# Patient Record
Sex: Female | Born: 1975 | Race: White | Hispanic: No | Marital: Married | Smoking: Never smoker
Health system: Southern US, Community
[De-identification: ages and names within clinical notes are randomized; demographics above are authoritative.]

## PROBLEM LIST (undated history)

## (undated) DIAGNOSIS — D649 Anemia, unspecified: Secondary | ICD-10-CM

## (undated) HISTORY — PX: BREAST CYST EXCISION: SHX579

## (undated) HISTORY — PX: BREAST LUMPECTOMY: SHX2

## (undated) HISTORY — DX: Anemia, unspecified: D64.9

## (undated) HISTORY — PX: WISDOM TOOTH EXTRACTION: SHX21

---

## 2008-01-14 ENCOUNTER — Ambulatory Visit (HOSPITAL_COMMUNITY): Admission: RE | Admit: 2008-01-14 | Discharge: 2008-01-14 | Payer: Self-pay | Admitting: Obstetrics and Gynecology

## 2013-05-10 ENCOUNTER — Other Ambulatory Visit (HOSPITAL_COMMUNITY): Payer: Self-pay | Admitting: Family Medicine

## 2013-05-10 DIAGNOSIS — R319 Hematuria, unspecified: Secondary | ICD-10-CM

## 2013-05-15 ENCOUNTER — Other Ambulatory Visit (HOSPITAL_COMMUNITY): Payer: Self-pay | Admitting: Family Medicine

## 2013-05-15 DIAGNOSIS — R319 Hematuria, unspecified: Secondary | ICD-10-CM

## 2013-05-17 ENCOUNTER — Ambulatory Visit (HOSPITAL_COMMUNITY)
Admission: RE | Admit: 2013-05-17 | Discharge: 2013-05-17 | Disposition: A | Discharge status: Home / Self Care | Payer: BC Managed Care – PPO | Source: Ambulatory Visit | Attending: Family Medicine | Admitting: Family Medicine

## 2013-05-17 DIAGNOSIS — N949 Unspecified condition associated with female genital organs and menstrual cycle: Secondary | ICD-10-CM | POA: Insufficient documentation

## 2013-05-17 DIAGNOSIS — N938 Other specified abnormal uterine and vaginal bleeding: Secondary | ICD-10-CM | POA: Insufficient documentation

## 2013-05-17 DIAGNOSIS — R319 Hematuria, unspecified: Secondary | ICD-10-CM

## 2013-05-17 DIAGNOSIS — N854 Malposition of uterus: Secondary | ICD-10-CM | POA: Insufficient documentation

## 2016-06-25 ENCOUNTER — Other Ambulatory Visit: Payer: Self-pay | Admitting: Family Medicine

## 2016-06-25 DIAGNOSIS — Z1231 Encounter for screening mammogram for malignant neoplasm of breast: Secondary | ICD-10-CM

## 2016-07-03 ENCOUNTER — Emergency Department (HOSPITAL_BASED_OUTPATIENT_CLINIC_OR_DEPARTMENT_OTHER): Payer: BC Managed Care – PPO

## 2016-07-03 ENCOUNTER — Encounter (HOSPITAL_BASED_OUTPATIENT_CLINIC_OR_DEPARTMENT_OTHER): Payer: Self-pay | Admitting: Emergency Medicine

## 2016-07-03 ENCOUNTER — Emergency Department (HOSPITAL_BASED_OUTPATIENT_CLINIC_OR_DEPARTMENT_OTHER)
Admission: EM | Admit: 2016-07-03 | Discharge: 2016-07-03 | Disposition: A | Discharge status: Home / Self Care | Payer: BC Managed Care – PPO | Attending: Emergency Medicine | Admitting: Emergency Medicine

## 2016-07-03 DIAGNOSIS — G43809 Other migraine, not intractable, without status migrainosus: Secondary | ICD-10-CM | POA: Diagnosis not present

## 2016-07-03 DIAGNOSIS — R110 Nausea: Secondary | ICD-10-CM | POA: Insufficient documentation

## 2016-07-03 DIAGNOSIS — H53149 Visual discomfort, unspecified: Secondary | ICD-10-CM | POA: Insufficient documentation

## 2016-07-03 DIAGNOSIS — G43909 Migraine, unspecified, not intractable, without status migrainosus: Secondary | ICD-10-CM | POA: Diagnosis present

## 2016-07-03 LAB — CBC WITH DIFFERENTIAL/PLATELET
BASOS ABS: 0 10*3/uL (ref 0.0–0.1)
BASOS PCT: 0 %
EOS ABS: 0 10*3/uL (ref 0.0–0.7)
Eosinophils Relative: 0 %
HEMATOCRIT: 36.3 % (ref 36.0–46.0)
HEMOGLOBIN: 12.3 g/dL (ref 12.0–15.0)
Lymphocytes Relative: 12 %
Lymphs Abs: 0.9 10*3/uL (ref 0.7–4.0)
MCH: 33.2 pg (ref 26.0–34.0)
MCHC: 33.9 g/dL (ref 30.0–36.0)
MCV: 97.8 fL (ref 78.0–100.0)
MONOS PCT: 5 %
Monocytes Absolute: 0.4 10*3/uL (ref 0.1–1.0)
NEUTROS ABS: 6.1 10*3/uL (ref 1.7–7.7)
NEUTROS PCT: 83 %
Platelets: 243 10*3/uL (ref 150–400)
RBC: 3.71 MIL/uL — AB (ref 3.87–5.11)
RDW: 11.9 % (ref 11.5–15.5)
WBC: 7.5 10*3/uL (ref 4.0–10.5)

## 2016-07-03 LAB — BASIC METABOLIC PANEL
Anion gap: 9 (ref 5–15)
BUN: 12 mg/dL (ref 6–20)
CHLORIDE: 101 mmol/L (ref 101–111)
CO2: 23 mmol/L (ref 22–32)
CREATININE: 0.66 mg/dL (ref 0.44–1.00)
Calcium: 8.7 mg/dL — ABNORMAL LOW (ref 8.9–10.3)
GFR calc Af Amer: >60 mL/min (ref >60)
GFR calc non Af Amer: >60 mL/min (ref >60)
GLUCOSE: 96 mg/dL (ref 65–99)
POTASSIUM: 3.5 mmol/L (ref 3.5–5.1)
SODIUM: 133 mmol/L — AB (ref 135–145)

## 2016-07-03 LAB — PREGNANCY, URINE: PREG TEST UR: NEGATIVE

## 2016-07-03 MED ORDER — DIPHENHYDRAMINE HCL 50 MG/ML IJ SOLN
25.0000 mg | Freq: Once | INTRAMUSCULAR | Status: AC
  Administered 2016-07-03: 25 mg via INTRAVENOUS
  Filled 2016-07-03: qty 1

## 2016-07-03 MED ORDER — SODIUM CHLORIDE 0.9 % IV BOLUS (SEPSIS)
1000.0000 mL | Freq: Once | INTRAVENOUS | Status: AC
  Administered 2016-07-03: 1000 mL via INTRAVENOUS

## 2016-07-03 MED ORDER — PROCHLORPERAZINE EDISYLATE 5 MG/ML IJ SOLN
10.0000 mg | Freq: Once | INTRAMUSCULAR | Status: AC
Start: 2016-07-03 — End: 2016-07-03
  Administered 2016-07-03: 10 mg via INTRAVENOUS
  Filled 2016-07-03: qty 2

## 2016-07-03 MED ORDER — KETOROLAC TROMETHAMINE 30 MG/ML IJ SOLN
30.0000 mg | Freq: Once | INTRAMUSCULAR | Status: AC
  Administered 2016-07-03: 30 mg via INTRAVENOUS
  Filled 2016-07-03: qty 1

## 2016-07-03 NOTE — ED Triage Notes (Signed)
Pt in c/o fall 2 days ago, and headache and nausea onset yesterday. Pt is alert, interactive, neuro intact and in NAD. States tried OTC meds with no relief, pt appears uncomfortable.

## 2016-07-03 NOTE — Discharge Instructions (Signed)
You may continue taking Tylenol and/or ibuprofen as prescribed over-the-counter as needed for pain relief. Continue drinking fluids at home to remain hydrated. Please follow up with a primary care provider from the Resource Guide provided below in 1 week as needed. Please return to the Emergency Department if symptoms worsen or new onset of fever, neck stiffness, visual changes, photophobia, abdominal pain, N/V, urinary symptoms, numbness, tingling, weakness, seizures, syncope.

## 2016-07-03 NOTE — ED Notes (Signed)
PA at bedside.

## 2016-07-03 NOTE — ED Provider Notes (Signed)
[facility] Provider Note   CSN: [id number] Arrival date & time: 07/03/16  1649  First Provider Contact:   First MD Initiated Contact with Patient 07/03/16 1705     By signing my name below, I, Emmanuella Mensah, attest that this documentation has been prepared under the direction and in the presence of Melburn HakeNicole Nadeau, PA-C. Electronically Signed: Angelene GiovanniEmmanuella Mensah, ED Scribe. 07/03/16. 5:17 PM.    History   Chief Complaint Chief Complaint  Patient presents with  . Migraine    HPI Comments: Catherine Copeland is a 40 y.o. female who presents to the Emergency Department complaining of gradually worsening constant moderate pressure generalized headache onset noon yesterday s/p fall that occurred 2 days ago. Pt explains that she was chasing a dog when she slipped and fell in water. No LOC. She states that she is unsure if she hit her head. She reports associated photophobia, nausea and posterior neck tightness since onset of her HA. She adds that she has been experiencing throbbing left ear where she can "hear her heart beat". She states that moving her head to the left and right makes her nauseous. No alleviating factors noted. Pt has tried OTC medications with no relief. She notes that these symptoms are not consistent with her past headaches. Pt denies fever, visual changes, abdominal pain, N/V, urinary symptoms, numbness, tingling, weakness, seizures, syncope.    The history is provided by the patient. No language interpreter was used.    History reviewed. No pertinent past medical history.  There are no active problems to display for this patient.   Past Surgical History:  Procedure Laterality Date  . BREAST LUMPECTOMY      OB History    No data available       Home Medications    Prior to Admission medications   Not on File    Family History History reviewed. No pertinent family history.  Social History Social History  Substance Use Topics  . Smoking  status: Never Smoker  . Smokeless tobacco: Not on file  . Alcohol use Yes     Allergies   Review of patient's allergies indicates no known allergies.   Review of Systems Review of Systems  Constitutional: Negative for fever.  HENT: Negative for congestion and rhinorrhea.   Eyes: Positive for photophobia.  Cardiovascular: Negative for chest pain.  Gastrointestinal: Positive for nausea. Negative for abdominal pain, diarrhea and vomiting.  Musculoskeletal: Positive for neck pain.  Neurological: Positive for headaches. Negative for dizziness, light-headedness and numbness.     Physical Exam Updated Vital Signs BP 101/59 (BP Location: Right Arm)   Pulse (!) 59   Temp 98.1 F (36.7 C) (Oral)   Ht 5\' 6"  (1.676 m)   Wt 59 kg   LMP 06/28/2016   SpO2 100%   BMI 20.98 kg/m   Physical Exam  Constitutional: She is oriented to person, place, and time. She appears well-developed and well-nourished. No distress.  HENT:  Head: Normocephalic and atraumatic. Head is without raccoon's eyes, without Battle's sign, without abrasion, without contusion and without laceration.  Right Ear: Tympanic membrane normal.  Left Ear: Tympanic membrane normal.  Nose: Nose normal. Right sinus exhibits no maxillary sinus tenderness and no frontal sinus tenderness. Left sinus exhibits no maxillary sinus tenderness and no frontal sinus tenderness.  Mouth/Throat: Uvula is midline, oropharynx is clear and moist and mucous membranes are normal. No oropharyngeal exudate, posterior oropharyngeal edema, posterior oropharyngeal erythema or tonsillar abscesses.  Eyes: Conjunctivae and  EOM are normal. Pupils are equal, round, and reactive to light. Right eye exhibits no discharge. Left eye exhibits no discharge. No scleral icterus.  No nystagmus  Neck: Normal range of motion. Neck supple. No tracheal deviation present.  Cardiovascular: Normal rate, regular rhythm, normal heart sounds and intact distal pulses.     Pulmonary/Chest: Effort normal. No respiratory distress. She has no wheezes. She has no rales. She exhibits no tenderness.  Abdominal: Soft. Bowel sounds are normal. She exhibits no distension and no mass. There is no tenderness. There is no rebound and no guarding. No hernia.  Musculoskeletal: Normal range of motion.  Lymphadenopathy:    She has no cervical adenopathy.  Neurological: She is alert and oriented to person, place, and time. She has normal strength. No cranial nerve deficit or sensory deficit. Coordination normal.  Skin: Skin is warm and dry.  Psychiatric: She has a normal mood and affect. Her behavior is normal.  Nursing note and vitals reviewed.    ED Treatments / Results  DIAGNOSTIC STUDIES: Oxygen Saturation is 100% on RA, normal by my interpretation.    COORDINATION OF CARE: 5:16 PM- Pt advised of plan for treatment and pt agrees. Pt will receive CT head for further evaluation.   Radiology Ct Head Wo Contrast  Result Date: 07/03/2016 CLINICAL DATA:  Worsening headaches, moderate pressure type headache since noon yesterday, fell 2 days ago chasing her dog, photophobia, nausea, posterior neck tightness, throbbing in LEFT ear, initial encounter EXAM: CT HEAD WITHOUT CONTRAST TECHNIQUE: Contiguous axial images were obtained from the base of the skull through the vertex without intravenous contrast. COMPARISON:  None FINDINGS: Mild atrophy for age. Normal ventricular morphology. No midline shift or mass effect. Dense calcifications in the anterior falx. Otherwise normal appearance of brain parenchyma. No intracranial hemorrhage, mass lesion, or acute infarction. Visualized paranasal sinuses clear. Bones unremarkable. IMPRESSION: No acute intracranial abnormalities. Electronically Signed   By: Ulyses Southward M.D.   On: 07/03/2016 17:38    Procedures Procedures (including critical care time)  Medications Ordered in ED Medications  sodium chloride 0.9 % bolus 1,000 mL (0 mLs  Intravenous Stopped 07/03/16 1826)  ketorolac (TORADOL) 30 MG/ML injection 30 mg (30 mg Intravenous Given 07/03/16 1745)  prochlorperazine (COMPAZINE) injection 10 mg (10 mg Intravenous Given 07/03/16 1748)  diphenhydrAMINE (BENADRYL) injection 25 mg (25 mg Intravenous Given 07/03/16 1740)     Initial Impression / Assessment and Plan / ED Course  Melburn Hake, PA-C has reviewed the triage vital signs and the nursing notes.  Pertinent labs & imaging results that were available during my care of the patient were reviewed by me and considered in my medical decision making (see chart for details).  Clinical Course    Patient reports headache with associated nausea and photophobia that started a few days ago after she fell while chasing her dog. Unsure of head injury. Denies LOC. VSS. Exam unremarkable, no neuro deficits. Patient given IV fluids and migraine cocktail. Labs unremarkable. CT head negative. On reevaluation patient reports her symptoms have significantly improved. Patient able to tolerate PO. Plan to discharge patient home with symptomatically treatment. Advised follow up with PCP as needed. Discussed return precautions.  Final Clinical Impressions(s) / ED Diagnoses   Final diagnoses:  Other migraine without status migrainosus, not intractable   I personally performed the services described in this documentation, which was scribed in my presence. The recorded information has been reviewed and is accurate.   New Prescriptions There are no discharge  medications for this patient.    Satira Sark [place], [place] 07/04/16 1610    Rolland Porter, MD 07/05/16 [phone]

## 2016-07-03 NOTE — ED Notes (Signed)
Pt c/o H/A, neck muscles tense, "pulsating left ear'. Larey SeatFell Friday-slipped and ? Landed on buttocks. Yest at noon developed H/A and nausea. Started period Tues/Weds and sometimes has H/A with that. Took pain meds this a.m.and got married.

## 2016-08-25 ENCOUNTER — Ambulatory Visit
Admission: RE | Admit: 2016-08-25 | Discharge: 2016-08-25 | Disposition: A | Discharge status: Home / Self Care | Payer: BC Managed Care – PPO | Source: Ambulatory Visit | Attending: Family Medicine | Admitting: Family Medicine

## 2016-08-25 DIAGNOSIS — Z1231 Encounter for screening mammogram for malignant neoplasm of breast: Secondary | ICD-10-CM

## 2017-08-31 ENCOUNTER — Other Ambulatory Visit: Payer: Self-pay | Admitting: Family Medicine

## 2017-08-31 DIAGNOSIS — Z1231 Encounter for screening mammogram for malignant neoplasm of breast: Secondary | ICD-10-CM

## 2017-09-21 ENCOUNTER — Ambulatory Visit
Admission: RE | Admit: 2017-09-21 | Discharge: 2017-09-21 | Disposition: A | Discharge status: Home / Self Care | Payer: BC Managed Care – PPO | Source: Ambulatory Visit | Attending: Family Medicine | Admitting: Family Medicine

## 2017-09-21 DIAGNOSIS — Z1231 Encounter for screening mammogram for malignant neoplasm of breast: Secondary | ICD-10-CM

## 2018-11-28 ENCOUNTER — Other Ambulatory Visit: Payer: Self-pay | Admitting: Family Medicine

## 2018-11-28 DIAGNOSIS — Z1231 Encounter for screening mammogram for malignant neoplasm of breast: Secondary | ICD-10-CM

## 2018-12-27 ENCOUNTER — Inpatient Hospital Stay: Admission: RE | Admit: 2018-12-27 | Payer: BC Managed Care – PPO | Source: Ambulatory Visit

## 2019-10-28 ENCOUNTER — Other Ambulatory Visit: Payer: Self-pay

## 2019-10-28 ENCOUNTER — Ambulatory Visit
Admission: RE | Admit: 2019-10-28 | Discharge: 2019-10-28 | Disposition: A | Discharge status: Home / Self Care | Payer: BC Managed Care – PPO | Source: Ambulatory Visit | Attending: Family Medicine | Admitting: Family Medicine

## 2019-10-28 DIAGNOSIS — Z1231 Encounter for screening mammogram for malignant neoplasm of breast: Secondary | ICD-10-CM

## 2021-05-25 ENCOUNTER — Other Ambulatory Visit: Payer: Self-pay

## 2021-05-25 ENCOUNTER — Ambulatory Visit
Admission: RE | Admit: 2021-05-25 | Discharge: 2021-05-25 | Disposition: A | Discharge status: Home / Self Care | Payer: BC Managed Care – PPO | Source: Ambulatory Visit | Attending: Family Medicine | Admitting: Family Medicine

## 2021-05-25 ENCOUNTER — Other Ambulatory Visit: Payer: Self-pay | Admitting: Family Medicine

## 2021-05-25 DIAGNOSIS — Z1231 Encounter for screening mammogram for malignant neoplasm of breast: Secondary | ICD-10-CM

## 2021-09-15 ENCOUNTER — Encounter: Payer: Self-pay | Admitting: Internal Medicine

## 2021-10-05 ENCOUNTER — Ambulatory Visit (AMBULATORY_SURGERY_CENTER): Payer: BC Managed Care – PPO | Admitting: *Deleted

## 2021-10-05 ENCOUNTER — Encounter: Payer: Self-pay | Admitting: Internal Medicine

## 2021-10-05 ENCOUNTER — Other Ambulatory Visit: Payer: Self-pay

## 2021-10-05 VITALS — Ht 66.0 in | Wt 140.0 lb

## 2021-10-05 DIAGNOSIS — Z1211 Encounter for screening for malignant neoplasm of colon: Secondary | ICD-10-CM

## 2021-10-05 MED ORDER — PLENVU 140 G PO SOLR: 1.0000 | ORAL | 0 refills | Status: DC

## 2021-10-05 NOTE — Progress Notes (Signed)
PV completed over the phone. Pt verified name, DOB, address and insurance during PV today.  Pt mailed instruction packet with copy of consent form to read and not return, and instructions.  Plenvu  coupon mailed in packet.  Pt encouraged to call with questions or issues.  If pt has My chart, procedure instructions sent via My Chart   No egg or soy allergy known to patient  No issues known to pt with past sedation with any surgeries or procedures Patient denies ever being told they had issues or difficulty with intubation  No FH of Malignant Hyperthermia Pt is not on diet pills Pt is not on  home 02  Pt is not on blood thinners  Pt denies issues with constipation  No A fib or A flutter  Pt is fully vaccinated  for Covid   Plenvu Coupon given to pt in PV today , Code to Pharmacy and  NO PA's for preps discussed with pt In PV today  Discussed with pt there will be an out-of-pocket cost for prep and that varies from $0 to 70 +  dollars - pt verbalized understanding   Due to the COVID-19 pandemic we are asking patients to follow certain guidelines in PV and the [facility]   Pt aware of COVID protocols and [facility] guidelines

## 2021-11-05 ENCOUNTER — Ambulatory Visit (AMBULATORY_SURGERY_CENTER): Payer: BC Managed Care – PPO | Admitting: Internal Medicine

## 2021-11-05 ENCOUNTER — Encounter: Payer: Self-pay | Admitting: Internal Medicine

## 2021-11-05 VITALS — BP 117/60 | HR 69 | Temp 97.5°F | Resp 18 | Ht 66.25 in | Wt 135.0 lb

## 2021-11-05 DIAGNOSIS — Z1211 Encounter for screening for malignant neoplasm of colon: Secondary | ICD-10-CM

## 2021-11-05 MED ORDER — SODIUM CHLORIDE 0.9 % IV SOLN: 500.0000 mL | Freq: Once | INTRAVENOUS | Status: DC

## 2021-11-05 NOTE — Op Note (Signed)
[facility] Patient Name: Catherine Copeland Procedure Date: 11/05/2021 12:09 PM MRN: [id number] Endoscopist: Nicole Kindred "Catherine Copeland ,  Age: 45 Referring MD:  Date of Birth: 14-Feb-1976 Gender: Female Account #: [id number] Procedure:                Colonoscopy Indications:              Screening for colorectal malignant neoplasm, This                            is the patient's first colonoscopy Medicines:                Monitored Anesthesia Care Procedure:                Pre-Anesthesia Assessment:                           - Prior to the procedure, a History and Physical                            was performed, and patient medications and                            allergies were reviewed. The patient's tolerance of                            previous anesthesia was also reviewed. The risks                            and benefits of the procedure and the sedation                            options and risks were discussed with the patient.                            All questions were answered, and informed consent                            was obtained. Prior Anticoagulants: The patient has                            taken no previous anticoagulant or antiplatelet                            agents. ASA Grade Assessment: II - A patient with                            mild systemic disease. After reviewing the risks                            and benefits, the patient was deemed in                            satisfactory condition to undergo the procedure.  After obtaining informed consent, the colonoscope                            was passed under direct vision. Throughout the                            procedure, the patient's blood pressure, pulse, and                            oxygen saturations were monitored continuously. The                            CF [id number] #[id number] was introduced through the anus                            and advanced to the  the cecum, identified by                            appendiceal orifice and ileocecal valve. The                            colonoscopy was performed without difficulty. The                            patient tolerated the procedure well. The quality                            of the bowel preparation was adequate. The                            ileocecal valve, appendiceal orifice, and rectum                            were photographed. Scope In: 12:16:34 PM Scope Out: 12:32:05 PM Scope Withdrawal Time: 0 hours 9 minutes 3 seconds  Total Procedure Duration: 0 hours 15 minutes 31 seconds  Findings:                 Non-bleeding internal hemorrhoids were found during                            retroflexion.                           The exam was otherwise without abnormality. Complications:            No immediate complications. Estimated Blood Loss:     Estimated blood loss: none. Impression:               - Non-bleeding internal hemorrhoids.                           - The examination was otherwise normal.                           - No specimens collected. Recommendation:           -  Discharge patient to home (with escort).                           - Repeat colonoscopy in 10 years for screening                            purposes.                           - The findings and recommendations were discussed                            with the patient. Nicole Kindred "Catherine Copeland,  11/05/2021 12:34:33 PM

## 2021-11-05 NOTE — Progress Notes (Signed)
1220 Report received for lunch relief, vss

## 2021-11-05 NOTE — Patient Instructions (Signed)
YOU HAD AN ENDOSCOPIC PROCEDURE TODAY AT THE [facility]:   Refer to the procedure report that was given to you for any specific questions about what was found during the examination.  If the procedure report does not answer your questions, please call your gastroenterologist to clarify.  If you requested that your care partner not be given the details of your procedure findings, then the procedure report has been included in a sealed envelope for you to review at your convenience later. ° °YOU SHOULD EXPECT: Some feelings of bloating in the abdomen. Passage of more gas than usual.  Walking can help get rid of the air that was put into your GI tract during the procedure and reduce the bloating. If you had a lower endoscopy (such as a colonoscopy or flexible sigmoidoscopy) you may notice spotting of blood in your stool or on the toilet paper. If you underwent a bowel prep for your procedure, you may not have a normal bowel movement for a few days. ° °Please Note:  You might notice some irritation and congestion in your nose or some drainage.  This is from the oxygen used during your procedure.  There is no need for concern and it should clear up in a day or so. ° °SYMPTOMS TO REPORT IMMEDIATELY: ° °Following lower endoscopy (colonoscopy): ° Excessive amounts of blood in the stool ° Significant tenderness or worsening of abdominal pains ° Swelling of the abdomen that is new, acute ° Fever of 100°F or higher ° °For urgent or emergent issues, a gastroenterologist can be reached at any hour by calling [phone]. °Do not use MyChart messaging for urgent concerns.  ° ° °DIET:  We do recommend a small meal at first, but then you may proceed to your regular diet.  Drink plenty of fluids but you should avoid alcoholic beverages for 24 hours. ° °ACTIVITY:  You should plan to take it easy for the rest of today and you should NOT DRIVE or use heavy machinery until tomorrow (because of the sedation medicines  used during the test).   ° °FOLLOW UP: °Our staff will call the number listed on your records 48-72 hours following your procedure to check on you and address any questions or concerns that you may have regarding the information given to you following your procedure. If we do not reach you, we will leave a message.  We will attempt to reach you two times.  During this call, we will ask if you have developed any symptoms of COVID 19. If you develop any symptoms (ie: fever, flu-like symptoms, shortness of breath, cough etc.) before then, please call [phone].  If you test positive for Covid 19 in the 2 weeks post procedure, please call and report this information to us.   ° °There were no polyps seen today!  You will need another screening colonoscopy in 10 years, you will receive a letter at that time when you are due for the procedure.   Please call us at [phone] if you have a change in bowel habits, change in family history of colo-rectal cancer, rectal bleeding or other GI concern before that time.  ° °SIGNATURES/CONFIDENTIALITY: °You and/or your care partner have signed paperwork which will be entered into your electronic medical record.  These signatures attest to the fact that that the information above on your After Visit Summary has been reviewed and is understood.  Full responsibility of the confidentiality of this discharge information lies with you and/or   your care-partner.  °

## 2021-11-05 NOTE — Progress Notes (Signed)
Report given to PACU, vss 

## 2021-11-05 NOTE — Progress Notes (Signed)
DT- VS ° °Pt's states no medical or surgical changes since previsit or office visit.  °

## 2021-11-05 NOTE — Progress Notes (Signed)
GASTROENTEROLOGY PROCEDURE H&P NOTE   Primary Care Physician: Macy Mis, MD    Reason for Procedure:   Colon cancer screening  Plan:    Colonoscopy  Patient is appropriate for endoscopic procedure(s) in the ambulatory ([facility]) setting.  The nature of the procedure, as well as the risks, benefits, and alternatives were carefully and thoroughly reviewed with the patient. Ample time for discussion and questions allowed. The patient understood, was satisfied, and agreed to proceed.     HPI: Catherine Copeland is a 45 y.o. female who presents for colonoscopy for colon cancer screening. Denies blood in stools, changes in bowel habits, weight loss. Mother had colon polyps in her 71s, unclear what kind. Denies fam hx of colon cancer. Denies use of blood thinners.  Past Medical History:  Diagnosis Date   Anemia    past hx    Past Surgical History:  Procedure Laterality Date   BREAST CYST EXCISION Left    benign   WISDOM TOOTH EXTRACTION      Prior to Admission medications   Medication Sig Start Date End Date Taking? Authorizing Provider  ascorbic acid (VITAMIN C) 100 MG tablet Take 500 mg by mouth daily.   Yes [provider]  b complex vitamins capsule Take 1 capsule by mouth daily.    [provider]  Probiotic Product (PROBIOTIC ADVANCED PO) Take by mouth.    [provider]    Current Outpatient Medications  Medication Sig Dispense Refill   ascorbic acid (VITAMIN C) 100 MG tablet Take 500 mg by mouth daily.     b complex vitamins capsule Take 1 capsule by mouth daily.     Probiotic Product (PROBIOTIC ADVANCED PO) Take by mouth.     Current Facility-Administered Medications  Medication Dose Route Frequency Provider Last Rate Last Admin   0.9 %  sodium chloride infusion  500 mL Intravenous Once Imogene Burn, MD        Allergies as of 11/05/2021   (No Known Allergies)    Family History  Problem Relation Age of Onset   Colon polyps Mother     Colon cancer Neg Hx    Esophageal cancer Neg Hx    Rectal cancer Neg Hx    Stomach cancer Neg Hx     Social History   Socioeconomic History   Marital status: Married    Spouse name: Not on file   Number of children: Not on file   Years of education: Not on file   Highest education level: Not on file  Occupational History   Not on file  Tobacco Use   Smoking status: Never   Smokeless tobacco: Never  Vaping Use   Vaping Use: Never used  Substance and Sexual Activity   Alcohol use: Yes    Comment: occ /social   Drug use: Never   Sexual activity: Yes    Comment: Husband had vasectomy  Other Topics Concern   Not on file  Social History Narrative   Not on file   Social Determinants of Health   Financial Resource Strain: Not on file  Food Insecurity: Not on file  Transportation Needs: Not on file  Physical Activity: Not on file  Stress: Not on file  Social Connections: Not on file  Intimate Partner Violence: Not on file    Physical Exam: Vital signs in last 24 hours: BP (!) 113/58    Pulse 73    Temp (!) 97.5 F (36.4 C)  Ht 5' 6.25" (1.683 m)    Wt 135 lb (61.2 kg)    LMP 10/15/2021 Comment: Husband had vasectomy   SpO2 100%    BMI 21.63 kg/m  GEN: NAD EYE: Sclerae anicteric ENT: MMM CV: Non-tachycardic Pulm: No increased work of breathing GI: Soft, NT/ND NEURO:  Alert & Oriented   Eulah Pont, MD [facility]  11/05/2021 12:08 PM

## 2021-11-09 ENCOUNTER — Telehealth: Payer: Self-pay | Admitting: *Deleted

## 2021-11-09 NOTE — Telephone Encounter (Signed)
°  Follow up Call-  Call back number 11/05/2021  Post procedure Call Back phone  # [phone] cell  Permission to leave phone message Yes  Some recent data might be hidden     Patient questions:  Do you have a fever, pain , or abdominal swelling? No. Pain Score  0 *  Have you tolerated food without any problems? Yes.    Have you been able to return to your normal activities? Yes.    Do you have any questions about your discharge instructions: Diet   No. Medications  No. Follow up visit  No.  Do you have questions or concerns about your Care? No.  Actions: * If pain score is 4 or above: No action needed, pain <4.

## 2021-11-15 IMAGING — MG MM DIGITAL SCREENING BILAT W/ TOMO AND CAD
8 series · 9 of 24 positions shown · non-contrast
Comparison: Previous exam(s).

CLINICAL DATA: Screening.

EXAM:
DIGITAL SCREENING BILATERAL MAMMOGRAM WITH TOMOSYNTHESIS AND CAD
TECHNIQUE: Bilateral screening digital craniocaudal and mediolateral oblique
mammograms were obtained. Bilateral screening digital breast
tomosynthesis was performed. The images were evaluated with
computer-aided detection.

[R MLO synth-2D]
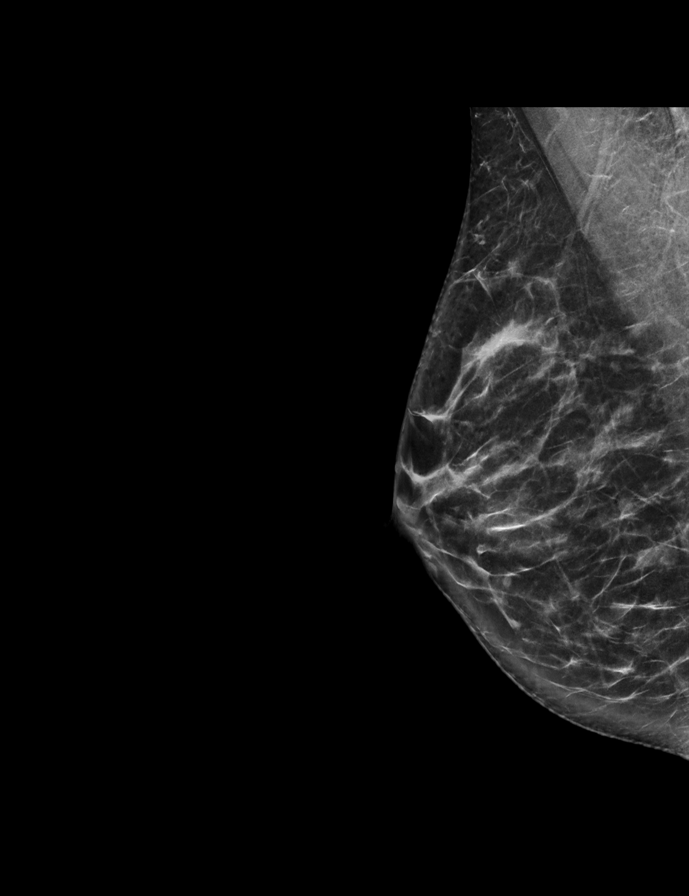

[R CC synth-2D]
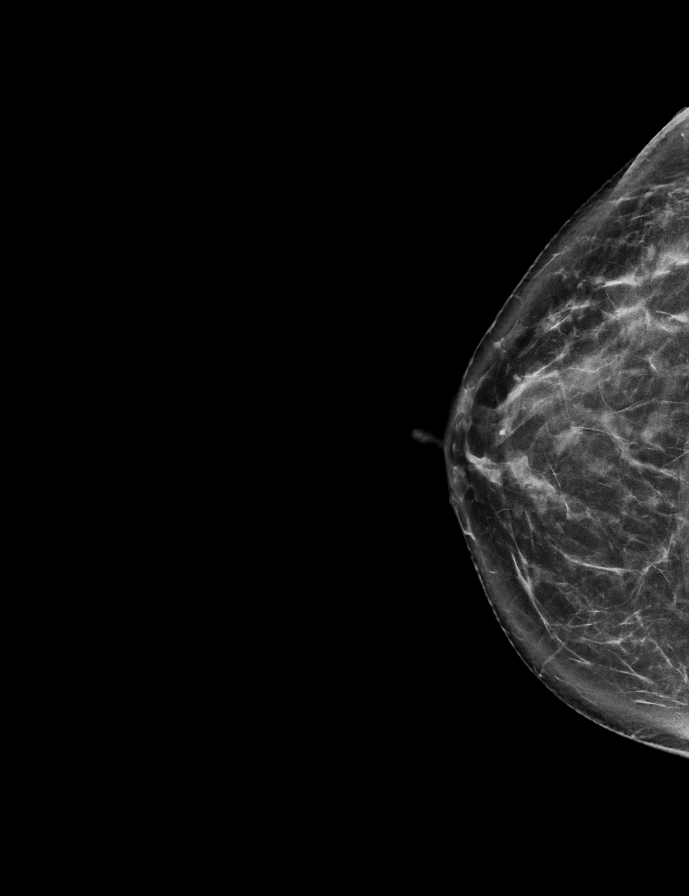

[L MLO synth-2D]
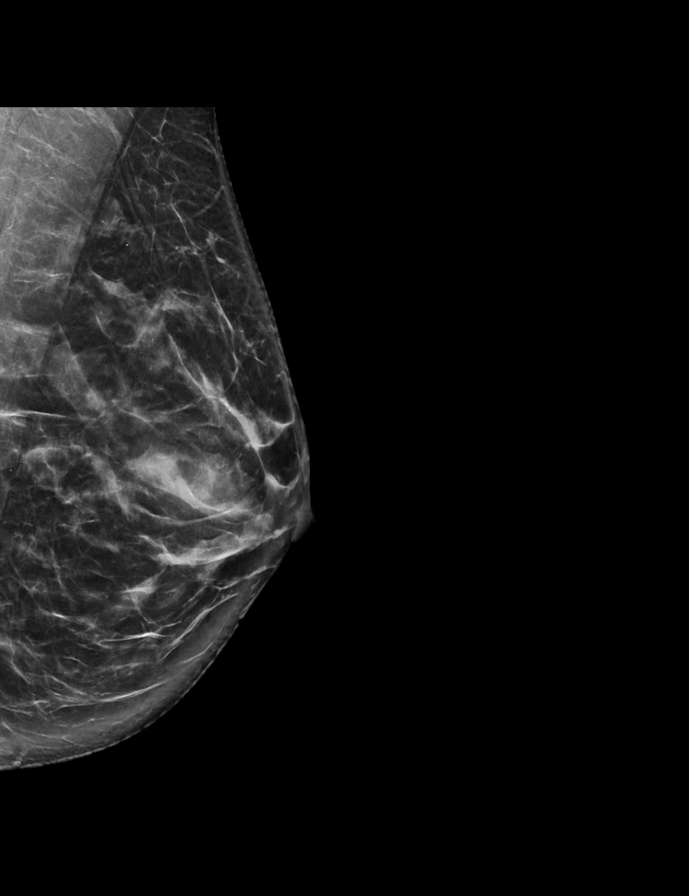

[L CC synth-2D]
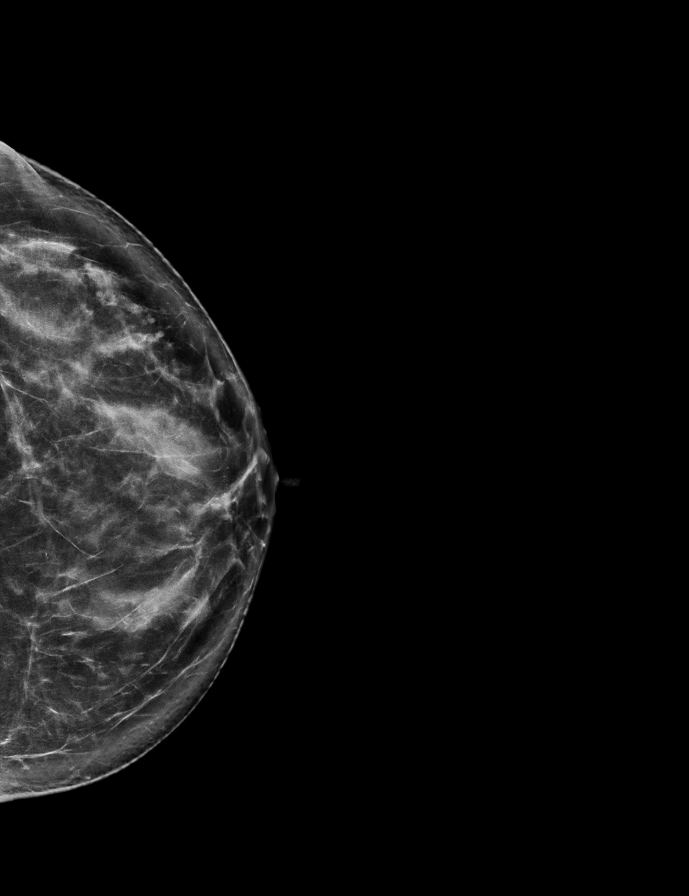

[R MLO tomo · 2 of 70 frames shown]
[frame 23/70]
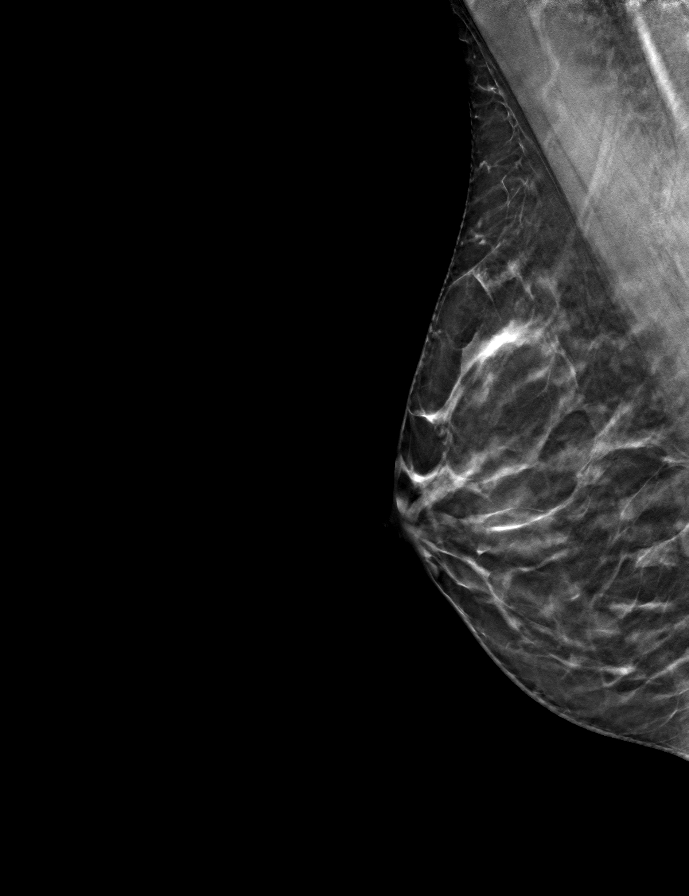
[frame 35/70]
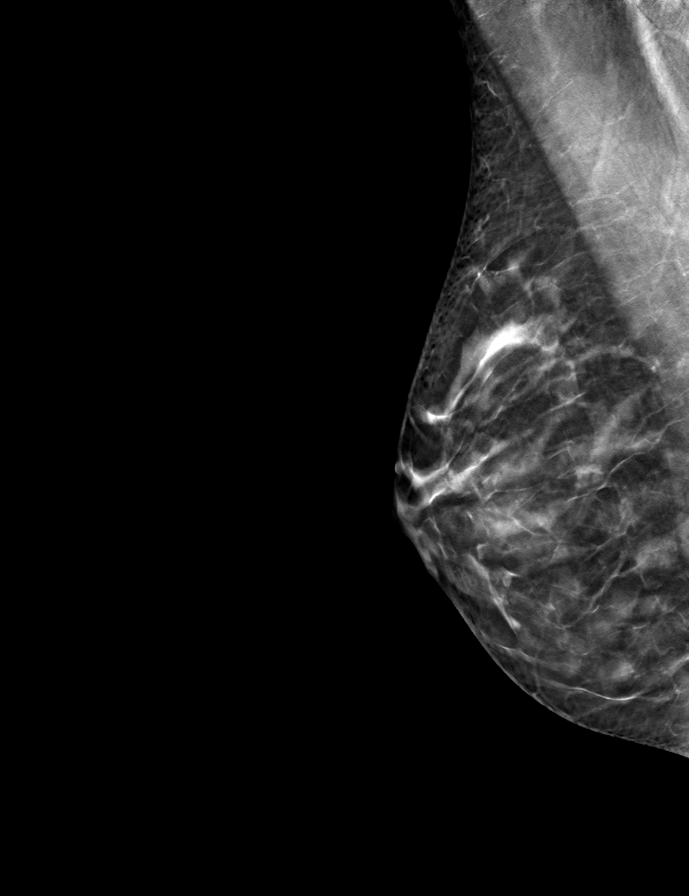

[L CC tomo · tomo slice 38/75.0]
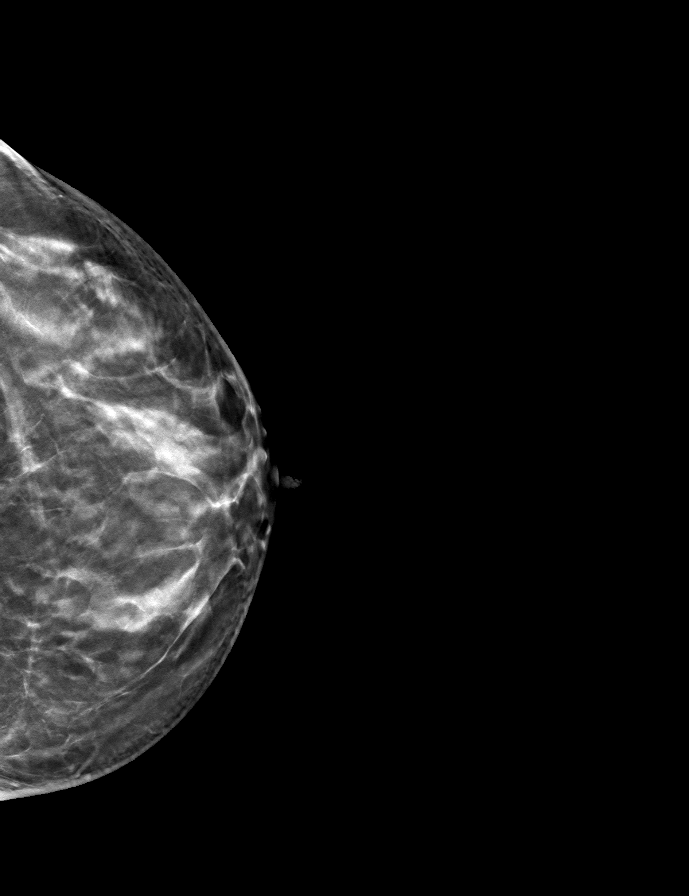

[R CC tomo · tomo slice 38/75.0]
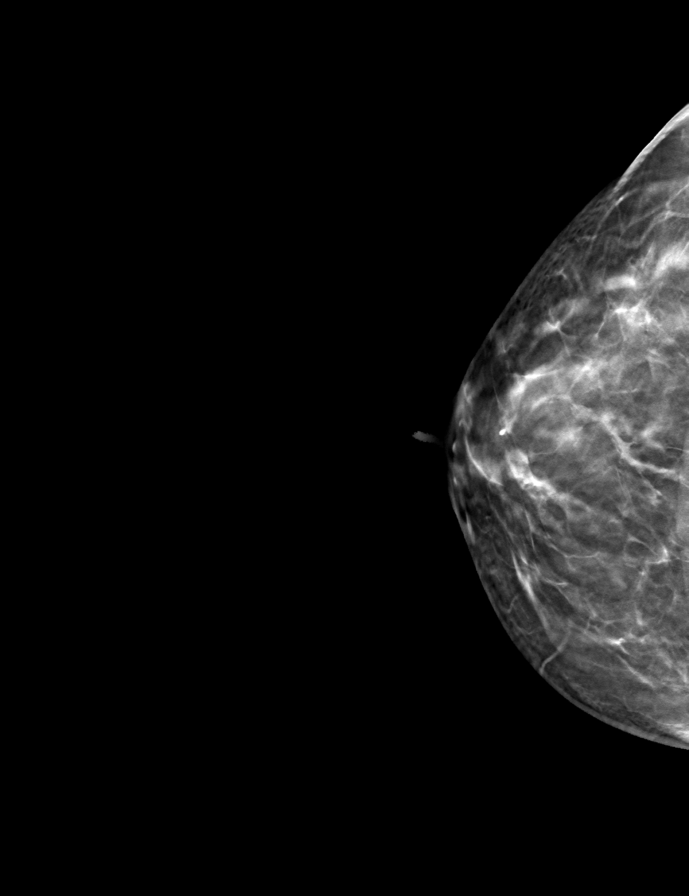

[L MLO tomo · tomo slice 37/73.0]
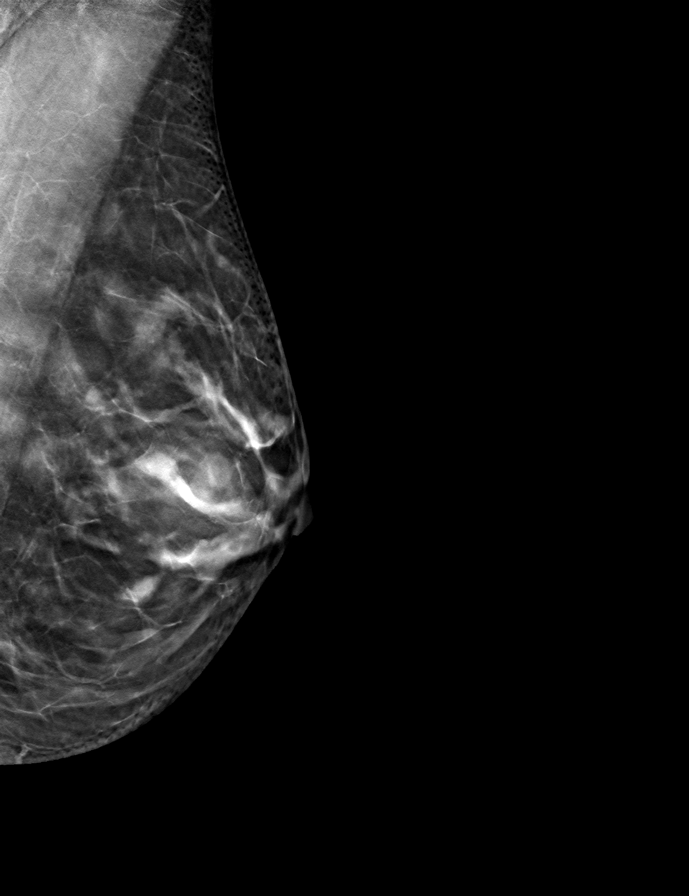

[9 of 24 positions shown; findings below may reference images not displayed]

ACR Breast Density Category c: The breast tissue is heterogeneously
dense, which may obscure small masses.
FINDINGS: There are no findings suspicious for malignancy.
IMPRESSION: No mammographic evidence of malignancy. A result letter of this
screening mammogram will be mailed directly to the patient.

RECOMMENDATION:
Screening mammogram in one year. (Code:Q3-W-BC3)

BI-RADS CATEGORY  1: Negative.

## 2022-10-06 ENCOUNTER — Other Ambulatory Visit: Payer: Self-pay | Admitting: Family Medicine

## 2022-10-06 DIAGNOSIS — Z1231 Encounter for screening mammogram for malignant neoplasm of breast: Secondary | ICD-10-CM

## 2022-12-02 ENCOUNTER — Ambulatory Visit
Admission: RE | Admit: 2022-12-02 | Discharge: 2022-12-02 | Disposition: A | Discharge status: Home / Self Care | Payer: BC Managed Care – PPO | Source: Ambulatory Visit | Attending: Family Medicine | Admitting: Family Medicine

## 2022-12-02 DIAGNOSIS — Z1231 Encounter for screening mammogram for malignant neoplasm of breast: Secondary | ICD-10-CM

## 2024-02-09 ENCOUNTER — Other Ambulatory Visit: Payer: Self-pay | Admitting: Family Medicine

## 2024-02-09 DIAGNOSIS — Z1231 Encounter for screening mammogram for malignant neoplasm of breast: Secondary | ICD-10-CM

## 2024-02-21 ENCOUNTER — Ambulatory Visit
Admission: RE | Admit: 2024-02-21 | Discharge: 2024-02-21 | Disposition: A | Discharge status: Home / Self Care | Payer: Self-pay | Source: Ambulatory Visit | Attending: Family Medicine | Admitting: Family Medicine

## 2024-02-21 DIAGNOSIS — Z1231 Encounter for screening mammogram for malignant neoplasm of breast: Secondary | ICD-10-CM
